# Patient Record
Sex: Male | Born: 1980 | Hispanic: Yes | Smoking: Never smoker
Health system: Southern US, Community
[De-identification: ages and names within clinical notes are randomized; demographics above are authoritative.]

## PROBLEM LIST (undated history)

## (undated) HISTORY — PX: WISDOM TOOTH EXTRACTION: SHX21

---

## 2006-02-22 ENCOUNTER — Encounter: Admission: RE | Admit: 2006-02-22 | Discharge: 2006-02-22 | Payer: Self-pay | Admitting: Occupational Medicine

## 2021-11-03 ENCOUNTER — Encounter: Payer: Self-pay | Admitting: Nurse Practitioner

## 2021-11-03 ENCOUNTER — Ambulatory Visit: Payer: Self-pay | Admitting: Nurse Practitioner

## 2021-11-03 VITALS — BP 132/72 | HR 86 | Temp 97.2°F | Ht 70.0 in | Wt 191.0 lb

## 2021-11-03 DIAGNOSIS — Z6827 Body mass index (BMI) 27.0-27.9, adult: Secondary | ICD-10-CM

## 2021-11-03 DIAGNOSIS — Z0000 Encounter for general adult medical examination without abnormal findings: Secondary | ICD-10-CM

## 2021-11-03 NOTE — Progress Notes (Signed)
? ?Subjective:  ?Patient ID: Douglas Blevins, male    DOB: 11/01/1980  Age: 41 y.o. MRN: [id number] ? ?Chief Complaint  ?Patient presents with  ? Establish Care  ? Annual Exam  ? ? ?HPI ?Douglas Blevins is a 41 year old Hispanic male that presents to establish care and have a physical exam. He denies any acute medical problems today. Bilingual staff member Tawny Asal, CMA present as interpretor.  ?Encounter for general adult medical examination without abnormal findings  ?Physical ("At Risk" items are starred): Patient's last physical exam was 1 year ago .  ? ?   ?SDOH Screenings  ? ?Alcohol Screen: Low Risk   ? Last Alcohol Screening Score (AUDIT): 1  ?Depression (PHQ2-9): Low Risk   ? PHQ-2 Score: 0  ?Financial Resource Strain: Low Risk   ? Difficulty of Paying Living Expenses: Not hard at all  ?Food Insecurity: No Food Insecurity  ? Worried About Programme researcher, broadcasting/film/video in the Last Year: Never true  ? Ran Out of Food in the Last Year: Never true  ?Housing: Low Risk   ? Last Housing Risk Score: 0  ?Physical Activity: Sufficiently Active  ? Days of Exercise per Week: 4 days  ? Minutes of Exercise per Session: 60 min  ?Social Connections: Socially Isolated  ? Frequency of Communication with Friends and Family: More than three times a week  ? Frequency of Social Gatherings with Friends and Family: More than three times a week  ? Attends Religious Services: Never  ? Active Member of Clubs or Organizations: No  ? Attends Banker Meetings: Never  ? Marital Status: Separated  ?Stress: No Stress Concern Present  ? Feeling of Stress : Not at all  ?Tobacco Use: Low Risk   ? Smoking Tobacco Use: Never  ? Smokeless Tobacco Use: Never  ? Passive Exposure: Not on file  ?Transportation Needs: No Transportation Needs  ? Lack of Transportation (Medical): No  ? Lack of Transportation (Non-Medical): No  ?  ? ?  11/03/2021  ? 10:51 AM  ?Fall Risk   ?Falls in the past year? 0  ?Number falls in past yr: 0  ?Injury with Fall? 0   ?Risk for fall due to : No Fall Risks  ?Follow up Falls evaluation completed  ?  ? ?  11/03/2021  ? 10:51 AM  ?Depression screen PHQ 2/9  ?Decreased Interest 0  ?Down, Depressed, Hopeless 0  ?PHQ - 2 Score 0  ?  ?Functional Status Survey: ?   ? ?Safety: reviewed ;  ?Patient wears a seat belt. ?Patient's home has smoke detectors and carbon monoxide detectors. ?Patient practices appropriate gun safety ?Patient does not wear sunscreen with extended sun exposure. ?Dental Care: does not have biannual cleanings, but does brush and flosses daily. ?Ophthalmology/Optometry: Never.  ?Hearing loss: none ?Vision impairments: none ?Patient is not afflicted from Stress Incontinence and Urge Incontinence  ? ?No current outpatient medications on file prior to visit.  ? ?No current facility-administered medications on file prior to visit.  ?  ?Social Hx   ?Social History  ? ?Socioeconomic History  ? Marital status: Legally Separated  ?  Spouse name: Not on file  ? Number of children: Not on file  ? Years of education: Not on file  ? Highest education level: Not on file  ?Occupational History  ? Not on file  ?Tobacco Use  ? Smoking status: Never  ? Smokeless tobacco: Never  ?Vaping Use  ? Vaping Use: Never used  ?Substance  and Sexual Activity  ? Alcohol use: Yes  ?  Comment: occ  ? Drug use: Never  ? Sexual activity: Not Currently  ?  Partners: Female  ?Other Topics Concern  ? Not on file  ?Social History Narrative  ? Not on file  ? ?Social Determinants of Health  ? ?Financial Resource Strain: Low Risk   ? Difficulty of Paying Living Expenses: Not hard at all  ?Food Insecurity: No Food Insecurity  ? Worried About Programme researcher, broadcasting/film/videounning Out of Food in the Last Year: Never true  ? Ran Out of Food in the Last Year: Never true  ?Transportation Needs: No Transportation Needs  ? Lack of Transportation (Medical): No  ? Lack of Transportation (Non-Medical): No  ?Physical Activity: Sufficiently Active  ? Days of Exercise per Week: 4 days  ? Minutes of  Exercise per Session: 60 min  ?Stress: No Stress Concern Present  ? Feeling of Stress : Not at all  ?Social Connections: Socially Isolated  ? Frequency of Communication with Friends and Family: More than three times a week  ? Frequency of Social Gatherings with Friends and Family: More than three times a week  ? Attends Religious Services: Never  ? Active Member of Clubs or Organizations: No  ? Attends BankerClub or Organization Meetings: Never  ? Marital Status: Separated  ? ?No past medical history on file. ?Family History  ?Problem Relation Age of Onset  ? Diabetes Mother   ? Diabetes Father   ? Heart attack Paternal Grandfather   ? ? ?Review of Systems  ?Constitutional:  Positive for fatigue. Negative for chills, diaphoresis and fever.  ?HENT:  Negative for congestion, ear pain and sore throat.   ?Respiratory:  Negative for cough and shortness of breath.   ?Cardiovascular:  Negative for chest pain and leg swelling.  ?Gastrointestinal:  Negative for abdominal pain, constipation, diarrhea, nausea and vomiting.  ?Genitourinary:  Negative for dysuria and urgency.  ?Musculoskeletal:  Negative for arthralgias and myalgias.  ?Neurological:  Negative for dizziness and headaches.  ?Psychiatric/Behavioral:  Positive for sleep disturbance (insomnia). Negative for dysphoric mood.   ?All other systems reviewed and are negative. ? ? ?Objective:  ?BP 132/72   Pulse 86   Temp (!) 97.2 ?F (36.2 ?C)   Ht 5\' 10"  (1.778 m)   Wt 191 lb (86.6 kg)   SpO2 99%   BMI 27.41 kg/m?    ? ? ?  11/03/2021  ? 10:49 AM  ?BP/Weight  ?Wt. (Lbs) 191  ?BMI 27.41 kg/m2  ? ? ?Physical Exam ?Vitals reviewed.  ?Constitutional:   ?   Appearance: Normal appearance.  ?HENT:  ?   Head: Normocephalic.  ?   Right Ear: Tympanic membrane normal.  ?   Left Ear: Tympanic membrane normal.  ?   Nose: Nose normal.  ?   Mouth/Throat:  ?   Mouth: Mucous membranes are moist.  ?Eyes:  ?   Pupils: Pupils are equal, round, and reactive to light.  ?Cardiovascular:  ?   Rate  and Rhythm: Normal rate and regular rhythm.  ?   Pulses: Normal pulses.  ?   Heart sounds: Normal heart sounds.  ?Pulmonary:  ?   Effort: Pulmonary effort is normal.  ?   Breath sounds: Normal breath sounds.  ?Abdominal:  ?   General: Bowel sounds are normal.  ?   Palpations: Abdomen is soft.  ?Musculoskeletal:     ?   General: Normal range of motion.  ?   Cervical back:  Neck supple.  ?Skin: ?   General: Skin is warm and dry.  ?   Capillary Refill: Capillary refill takes less than 2 seconds.  ?Neurological:  ?   General: No focal deficit present.  ?   Mental Status: He is alert and oriented to person, place, and time.  ?Psychiatric:     ?   Mood and Affect: Mood normal.     ?   Behavior: Behavior normal.  ? ? ? ? ? ? ?Assessment & Plan:  ?  ?1. Routine general medical examination at a health care facility ?- Lipid panel ?- Comprehensive metabolic panel ?- Hemoglobin A1c ?- TSH ?- Testosterone,Free and Total ?- CBC with Differential/Platelet ? ?2. BMI 27.0-27.9,adult ?  ? ? ?These are the goals we discussed: ? Goals   ?Weight loss ?  ?  ? ?This is a list of the screening recommended for you and due dates:  ?Health Maintenance  ?Topic Date Due  ? Flu Shot  01/31/2022  ? Tetanus Vaccine  02/10/2024  ? HPV Vaccine  Aged Out  ?  ?Take Melatonin as needed for insomnia ?Recommend routine eye exam every 2 years ?We will call you with lab results ?Follow-up 1 year ? ?AN INDIVIDUALIZED CARE PLAN: was established or reinforced today.   ?SELF MANAGEMENT: The patient and I together assessed ways to personally work towards obtaining the recommended goals  ?Support needs The patient and/or family needs were assessed and services were offered and not necessary at this time.   ? ?Follow-up: 1-year ? ? ?I, Janie Morning, NP, have reviewed all documentation for this visit. The documentation on 11/03/21 for the exam, diagnosis, procedures, and orders are all accurate and complete.  ? ?Signed, ?Flonnie Hailstone, DNP ?[facility] ?[phone] ? ? ?

## 2021-11-03 NOTE — Patient Instructions (Addendum)
Take Melatonin as needed for insomnia ?Recommend routine eye exam every 2 years ?We will call you with lab results ?Follow-up 1 year ?Cuidados preventivos en los hombres de 40 a 64 a?os de edad ?Preventive Care 3540-43 Years Old, Male ?Los cuidados preventivos hacen referencia a las opciones en cuanto al estilo de vida y a las visitas al m?dico, las cuales pueden promover la salud y Counsellorel bienestar. Las visitas de cuidado preventivo tambi?n se denominan ex?menes de Health visitorbienestar. ??Qu? puedo esperar para mi visita de cuidado preventivo? ?Asesoramiento ?Durante la visita de cuidado preventivo, el m?dico puede preguntarle sobre lo siguiente: ?Antecedentes m?dicos, incluidos los siguientes: ?Problemas m?dicos pasados. ?Antecedentes m?dicos familiares. ?Salud actual, incluido lo siguiente: ?Su bienestar emocional. ?Bienestar en el hogar y las relaciones personales. ?Su actividad sexual. ?Estilo de vida, incluido lo siguiente: ?Consumo de alcohol, nicotina, tabaco o drogas. ?Acceso a armas de fuego. ?H?bitos de alimentaci?n, ejercicio y sue?o. ?Cuestiones de seguridad, como el uso de cintur?n de seguridad y casco de Scientist, research (physical sciences)bicicleta. ?Uso de pantalla solar. ?Brenton GrillsSu trabajo y Greeceambiente laboral. ?Examen f?sico ?El m?dico revisar? lo siguiente: ?Estatura y Anstedpeso. Estos pueden usarse para calcular el IMC (?ndice de masa corporal). [facility] es una medici?n que indica si tiene un peso saludable. ?Circunferencia de la cintura. Es una medici?n alrededor de Lobbyistla cintura. Esta medici?n tambi?n indica si tiene un peso saludable y puede ayudar a predecir su riesgo de padecer ciertas enfermedades, como diabetes tipo 2 y presi?n arterial alta. ?Frecuencia card?aca y presi?n arterial. ?Temperatura corporal. ?Piel para Training and development officerdetectar manchas anormales. ??Qu? vacunas necesito? ? ?Las vacunas se aplican a varias edades, seg?n un cronograma. El m?dico le recomendar? vacunas seg?n su edad, sus antecedentes m?dicos, su estilo de vida y [address]otros factores, como los viajes o el  lugar donde trabaja. ??Qu? pruebas necesito? ?Pruebas de detecci?n ?El m?dico puede recomendar pruebas de detecci?n de ciertas afecciones. Esto puede incluir: ?Niveles de l?pidos y colesterol. ?Pruebas de detecci?n de la diabetes. Esto se realiza mediante un control del az?car en la sangre (glucosa) despu?s de no haber comido durante un periodo de tiempo (ayuno). ?Prueba de hepatitis B. ?Prueba de hepatitis C. ?Prueba del VIH (virus de inmunodeficiencia humana). ?Pruebas de infecciones de transmisi?n sexual (ITS), si est? en riesgo. ?Pruebas de detecci?n de c?ncer de pulm?n. ?Examen de detecci?n del c?ncer de pr?stata. ?Pruebas de detecci?n de c?ncer colorrectal. ?Hable con su m?dico sobre los Lubrizol Corporationresultados de las pruebas, las opciones de tratamiento y, si corresponde, la necesidad de Education officer, environmentalrealizar m?s pruebas. ?Siga estas instrucciones en su casa: ?Comida y bebida ? ?Siga una dieta que incluya frutas y verduras frescas, cereales integrales, prote?nas magras y productos l?cteos descremados. ?Tome los suplementos vitam?nicos y Owens-Illinoisminerales como se lo haya indicado el m?dico. ?No beba alcohol si el m?dico se lo proh?be. ?Si bebe alcohol: ?Limite la cantidad que consume de 0 a 2 medidas por d?a. ?Sepa cu?nta cantidad de alcohol hay en las bebidas que toma. En los [address], una medida equivale a una botella de cerveza de 12 oz (355 ml), un vaso de vino de 5 oz (148 ml) o un vaso de una bebida alcoh?lica de alta graduaci?n de 1? oz (44 ml). ?Estilo de vida ?Cep?llese los dientes a la ma?ana y a la noche con pasta dental con fluoruro. Use hilo dental una vez al d?a. ?Haga al menos 30 minutos de ejercicio, 5 o m?s d?as cada semana. ?No consuma ning?n producto que contenga nicotina o tabaco. Estos productos incluyen cigarrillos, tabaco para Theatre managermascar y aparatos  de vapeo, como los cigarrillos electr?nicos. Si necesita ayuda para dejar de fumar, consulte al m?dico. ?No consuma drogas. ?Si es sexualmente activo, practique sexo seguro.  Use un cond?n u otra forma de protecci?n para prevenir las infecciones de transmisi?n sexual (ITS). ?Tome aspirina ?nicamente como se lo haya indicado el m?dico. Aseg?rese de que comprende qu? cantidad y cu?l presentaci?n debe tomar. Trabaje con el m?dico para averiguar si es seguro y beneficioso para usted tomar aspirina a diario. ?Busque maneras saludables de controlar el estr?s, tales como: ?Meditaci?n, yoga o escuchar m?sica. ?Lleve un diario personal. ?Hable con una persona confiable. ?Pase tiempo con amigos y familiares. ?Minimice la exposici?n a la radiaci?n UV para reducir el riesgo de c?ncer de piel. ?Seguridad ?[place] siempre el cintur?n de seguridad al conducir o viajar en un veh?culo. ?No conduzca: ?Si ha estado bebiendo alcohol. No viaje con un conductor que ha estado bebiendo. ?Si est? cansado o distra?do. ?Mientras est? enviando mensajes de texto. ?Si ha estado usando sustancias o drogas que alteran la funci?n mental. ?Use un casco y otros equipos de protecci?n durante las actividades deportivas. ?Si tiene armas de fuego en su casa, aseg?rese de seguir todos los procedimientos de seguridad correspondientes. ??Cu?ndo volver? ?Acuda al m?dico una vez al a?o para una visita anual de control de bienestar. ?Preg?ntele al m?dico con qu? frecuencia debe realizarse un control de la vista y los dientes. ?Mantenga su esquema de vacunaci?n al d?a. ?Esta informaci?n no tiene Theme park manager el consejo del m?dico. Aseg?rese de hacerle al m?dico cualquier pregunta que tenga. ?Document Revised: 01/06/2021 Document Reviewed: 01/06/2021 ?Elsevier Patient Education ? 2023 Elsevier Inc. ? ? ?Melatonin Capsules or Tablets ??Qu? es [employer]? ?La MELATONINA se promociona para trastornos del sue?o, tales como insomnio o desfase horario. La melatonina ayuda a regular su ciclo del sue?o. Este suplemento no est? hecho para diagnosticar, tratar, curar o prevenir ninguna enfermedad. ?[employer] puede ser utilizado  para otros usos; si tiene alguna pregunta consulte con su proveedor de atenci?n m?dica o con su farmac?utico. ?MARCAS COMUNES: Melatonex ??Qu? le debo informar a mi profesional de la salud antes de tomar este medicamento? ?Necesitan saber si usted presenta alguno de los siguientes problemas o situaciones: ?C?ncer ?Depresi?n o enfermedad mental ?Diabetes ?Problemas hormonales ?Si bebe alcohol con frecuencia ?Problemas del sistema inmunol?gico ?Enfermedad hep?tica ?Enfermedad pulmonar o respiratoria, como asma ?Trasplante de ?rganos ?Trastorno convulsivo ?Una reacci?n al?rgica o inusual a la melatonina, a otros medicamentos, alimentos, colorantes o conservantes ?Si est? embarazada o buscando quedar embarazada ?Si est? amamantando a un beb? ??C?mo debo utilizar este medicamento? ?Tome este medicamento por v?a oral con un vaso de agua. No lo tome con alimentos. Este medicamento por lo general se toma 1 o 2 horas antes de acostarse. Despu?s de tomar [employer], limite sus actividades a las necesarias para prepararse para acostarse. Algunos productos pueden masticarse o disolverse en la boca antes de tragarlos. Algunas tabletas o c?psulas deben tragarse enteras; no las corte, triture ni mastique. Siga las instrucciones de la etiqueta del envase o use del modo indicado por su equipo de atenci?n. No use este medicamento con una frecuencia mayor a la indicada. ?Hable con su equipo de atenci?n sobre el uso de este medicamento en ni?os. Puede requerir atenci?n especial. No se recomienda el uso de este medicamento en ni?os sin una receta m?dica. ?Sobredosis: P?ngase en contacto inmediatamente con un centro toxicol?gico o una sala de urgencia si usted cree que haya tomado demasiado medicamento. ?ATENCI?N: [employer] es  solo para usted. No comparta este medicamento con nadie. ??Qu? sucede si me olvido de una dosis? ?Si se olvida una dosis a la hora habitual, s?ltese la dosis olvidada. Si es casi la hora de la pr?xima  dosis, tome s?lo esa dosis. No tome dosis adicionales o dobles. ??Qu? puede interactuar con este medicamento? ?No use este medicamento con ninguno de los siguientes productos: ?Fluvoxamina ?Ramelte?

## 2021-11-05 LAB — LIPID PANEL
Chol/HDL Ratio: 4.1 ratio (ref 0.0–5.0)
Cholesterol, Total: 210 mg/dL — ABNORMAL HIGH (ref 100–199)
HDL: 51 mg/dL (ref >39)
LDL Chol Calc (NIH): 135 mg/dL — ABNORMAL HIGH (ref 0–99)
Triglycerides: 133 mg/dL (ref 0–149)
VLDL Cholesterol Cal: 24 mg/dL (ref 5–40)

## 2021-11-05 LAB — TESTOSTERONE,FREE AND TOTAL
Testosterone, Free: 12.8 pg/mL (ref 6.8–21.5)
Testosterone: 355 ng/dL (ref 264–916)

## 2021-11-05 LAB — CBC WITH DIFFERENTIAL/PLATELET
Basophils Absolute: 0 10*3/uL (ref 0.0–0.2)
Basos: 1 %
EOS (ABSOLUTE): 0.1 10*3/uL (ref 0.0–0.4)
Eos: 2 %
Hematocrit: 46.4 % (ref 37.5–51.0)
Hemoglobin: 15.9 g/dL (ref 13.0–17.7)
Immature Grans (Abs): 0 10*3/uL (ref 0.0–0.1)
Immature Granulocytes: 0 %
Lymphocytes Absolute: 2.3 10*3/uL (ref 0.7–3.1)
Lymphs: 36 %
MCH: 29.9 pg (ref 26.6–33.0)
MCHC: 34.3 g/dL (ref 31.5–35.7)
MCV: 87 fL (ref 79–97)
Monocytes Absolute: 0.5 10*3/uL (ref 0.1–0.9)
Monocytes: 8 %
Neutrophils Absolute: 3.3 10*3/uL (ref 1.4–7.0)
Neutrophils: 53 %
Platelets: 303 10*3/uL (ref 150–450)
RBC: 5.32 x10E6/uL (ref 4.14–5.80)
RDW: 13.1 % (ref 11.6–15.4)
WBC: 6.2 10*3/uL (ref 3.4–10.8)

## 2021-11-05 LAB — COMPREHENSIVE METABOLIC PANEL
ALT: 29 IU/L (ref 0–44)
AST: 26 IU/L (ref 0–40)
Albumin/Globulin Ratio: 1.7 (ref 1.2–2.2)
Albumin: 4.8 g/dL (ref 4.0–5.0)
Alkaline Phosphatase: 69 IU/L (ref 44–121)
BUN/Creatinine Ratio: 12 (ref 9–20)
BUN: 14 mg/dL (ref 6–24)
Bilirubin Total: 0.4 mg/dL (ref 0.0–1.2)
CO2: 23 mmol/L (ref 20–29)
Calcium: 9.9 mg/dL (ref 8.7–10.2)
Chloride: 102 mmol/L (ref 96–106)
Creatinine, Ser: 1.16 mg/dL (ref 0.76–1.27)
Globulin, Total: 2.8 g/dL (ref 1.5–4.5)
Glucose: 90 mg/dL (ref 70–99)
Potassium: 4.7 mmol/L (ref 3.5–5.2)
Sodium: 142 mmol/L (ref 134–144)
Total Protein: 7.6 g/dL (ref 6.0–8.5)
eGFR: 81 mL/min/{1.73_m2} (ref >59)

## 2021-11-05 LAB — CARDIOVASCULAR RISK ASSESSMENT

## 2021-11-05 LAB — HEMOGLOBIN A1C
Est. average glucose Bld gHb Est-mCnc: 114 mg/dL
Hgb A1c MFr Bld: 5.6 % (ref 4.8–5.6)

## 2021-11-05 LAB — TSH: TSH: 1.39 u[IU]/mL (ref 0.450–4.500)

## 2022-11-06 ENCOUNTER — Encounter: Payer: Self-pay | Admitting: Nurse Practitioner

## 2022-11-06 ENCOUNTER — Encounter: Payer: Self-pay | Admitting: Physician Assistant

## 2022-11-06 NOTE — Progress Notes (Deleted)
Subjective:  Patient ID: Douglas Blevins, male    DOB: 1981-03-16  Age: 42 y.o. MRN: [id number]  No chief complaint on file.   HPI  Well Adult Physical: Patient here for a comprehensive physical exam.The patient reports {problems:16946} Do you take any herbs or supplements that were not prescribed by a doctor? {yes/no/not asked:9010} Are you taking calcium supplements? {yes/no:63} Are you taking aspirin daily? {yes/no:63}  Encounter for general adult medical examination without abnormal findings  Physical ("At Risk" items are starred): Patient's last physical exam was 1 year ago .  Patient wears a seat belt, has smoke detectors, has carbon monoxide detectors, practices appropriate gun safety, and wears sunscreen with extended sun exposure. Dental Care: biannual cleanings, brushes and flosses daily. Ophthalmology/Optometry: Annual visit.  Hearing loss: none Vision impairments: none Last PSA:     11/03/2021   10:51 AM  Depression screen PHQ 2/9  Decreased Interest 0  Down, Depressed, Hopeless 0  PHQ - 2 Score 0         11/03/2021   10:51 AM  Fall Risk  Falls in the past year? 0  Was there an injury with Fall? 0  Fall Risk Category Calculator 0  Fall Risk Category (Retired) Low  (RETIRED) Patient Fall Risk Level Low fall risk  Patient at Risk for Falls Due to No Fall Risks  Fall risk Follow up Falls evaluation completed              No past medical history on file. Past Surgical History:  Procedure Laterality Date   WISDOM TOOTH EXTRACTION      Family History  Problem Relation Age of Onset   Diabetes Mother    Diabetes Father    Heart attack Paternal Grandfather    Social History   Socioeconomic History   Marital status: Legally Separated    Spouse name: Not on file   Number of children: Not on file   Years of education: Not on file   Highest education level: Not on file  Occupational History   Not on file  Tobacco Use   Smoking status: Never    Smokeless tobacco: Never  Vaping Use   Vaping Use: Never used  Substance and Sexual Activity   Alcohol use: Yes    Comment: occ   Drug use: Never   Sexual activity: Not Currently    Partners: Female  Other Topics Concern   Not on file  Social History Narrative   Not on file   Social Determinants of Health   Financial Resource Strain: Low Risk  (11/03/2021)   Overall Financial Resource Strain (CARDIA)    Difficulty of Paying Living Expenses: Not hard at all  Food Insecurity: No Food Insecurity (11/03/2021)   Hunger Vital Sign    Worried About Running Out of Food in the Last Year: Never true    Ran Out of Food in the Last Year: Never true  Transportation Needs: No Transportation Needs (11/03/2021)   PRAPARE - Administrator, Civil Service (Medical): No    Lack of Transportation (Non-Medical): No  Physical Activity: Sufficiently Active (11/03/2021)   Exercise Vital Sign    Days of Exercise per Week: 4 days    Minutes of Exercise per Session: 60 min  Stress: No Stress Concern Present (11/03/2021)   [employer] of Occupational Health - Occupational Stress Questionnaire    Feeling of Stress : Not at all  Social Connections: Socially Isolated (11/03/2021)   Social Connection and  Isolation Panel [NHANES]    Frequency of Communication with Friends and Family: More than three times a week    Frequency of Social Gatherings with Friends and Family: More than three times a week    Attends Religious Services: Never    Database administrator or Organizations: No    Attends Banker Meetings: Never    Marital Status: Separated   Review of Systems   Objective:  There were no vitals taken for this visit.     11/03/2021   10:49 AM  BP/Weight  Systolic BP 132  Diastolic BP 72  Wt. (Lbs) 191  BMI 27.41 kg/m2    Physical Exam  Lab Results  Component Value Date   WBC 6.2 11/03/2021   HGB 15.9 11/03/2021   HCT 46.4 11/03/2021   PLT 303 11/03/2021   GLUCOSE 90  11/03/2021   CHOL 210 (H) 11/03/2021   TRIG 133 11/03/2021   HDL 51 11/03/2021   LDLCALC 135 (H) 11/03/2021   ALT 29 11/03/2021   AST 26 11/03/2021   NA 142 11/03/2021   K 4.7 11/03/2021   CL 102 11/03/2021   CREATININE 1.16 11/03/2021   BUN 14 11/03/2021   CO2 23 11/03/2021   TSH 1.390 11/03/2021   HGBA1C 5.6 11/03/2021      Assessment & Plan:  No problem-specific Assessment & Plan notes found for this encounter.    There is no height or weight on file to calculate BMI.   These are the goals we discussed:  Goals   None      This is a list of the screening recommended for you and due dates:  Health Maintenance  Topic Date Due   COVID-19 Vaccine (1) Never done   Flu Shot  02/01/2023   DTaP/Tdap/Td vaccine (2 - Td or Tdap) 02/10/2024   HPV Vaccine  Aged Out     No orders of the defined types were placed in this encounter.    Follow-up: No follow-ups on file.  An After Visit Summary was printed and given to the patient.  Langley Gauss, [place] [facility] [phone]

## 2022-12-07 ENCOUNTER — Encounter: Payer: Self-pay | Admitting: Nurse Practitioner
